# Patient Record
Sex: Male | Born: 2000 | Race: White | Hispanic: No | Marital: Single | Smoking: Never smoker
Health system: Southern US, Community
[De-identification: ages and names within clinical notes are randomized; demographics above are authoritative.]

## PROBLEM LIST (undated history)

## (undated) HISTORY — PX: NO PAST SURGERIES: SHX2092

---
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
## 2024-02-09 ENCOUNTER — Other Ambulatory Visit: Payer: Self-pay

## 2024-02-09 ENCOUNTER — Emergency Department (HOSPITAL_BASED_OUTPATIENT_CLINIC_OR_DEPARTMENT_OTHER): Payer: Self-pay

## 2024-02-09 ENCOUNTER — Encounter (HOSPITAL_BASED_OUTPATIENT_CLINIC_OR_DEPARTMENT_OTHER): Payer: Self-pay

## 2024-02-09 ENCOUNTER — Emergency Department (HOSPITAL_BASED_OUTPATIENT_CLINIC_OR_DEPARTMENT_OTHER)
Admission: EM | Admit: 2024-02-09 | Discharge: 2024-02-09 | Disposition: A | Discharge status: Home / Self Care | Payer: Self-pay | Attending: Emergency Medicine | Admitting: Emergency Medicine

## 2024-02-09 DIAGNOSIS — W3400XA Accidental discharge from unspecified firearms or gun, initial encounter: Secondary | ICD-10-CM | POA: Insufficient documentation

## 2024-02-09 DIAGNOSIS — S62609B Fracture of unspecified phalanx of unspecified finger, initial encounter for open fracture: Secondary | ICD-10-CM

## 2024-02-09 DIAGNOSIS — Z23 Encounter for immunization: Secondary | ICD-10-CM | POA: Insufficient documentation

## 2024-02-09 DIAGNOSIS — S62610B Displaced fracture of proximal phalanx of right index finger, initial encounter for open fracture: Secondary | ICD-10-CM | POA: Insufficient documentation

## 2024-02-09 HISTORY — DX: Accidental discharge from unspecified firearms or gun, initial encounter: W34.00XA

## 2024-02-09 MED ORDER — CEPHALEXIN 500 MG PO CAPS: 500.0000 mg | ORAL_CAPSULE | Freq: Three times a day (TID) | ORAL | 0 refills | Status: DC

## 2024-02-09 MED ORDER — HYDROCODONE-ACETAMINOPHEN 5-325 MG PO TABS: 1.0000 | ORAL_TABLET | ORAL | 0 refills | Status: AC | PRN

## 2024-02-09 MED ORDER — BACITRACIN ZINC 500 UNIT/GM EX OINT
TOPICAL_OINTMENT | Freq: Two times a day (BID) | CUTANEOUS | Status: DC
  Administered 2024-02-09: 31.5 via TOPICAL
  Filled 2024-02-09: qty 28.35

## 2024-02-09 MED ORDER — BUPIVACAINE HCL (PF) 0.5 % IJ SOLN
10.0000 mL | Freq: Once | INTRAMUSCULAR | Status: AC
  Administered 2024-02-09: 10 mL
  Filled 2024-02-09: qty 10

## 2024-02-09 MED ORDER — CLINDAMYCIN PHOSPHATE 900 MG/50ML IV SOLN
900.0000 mg | Freq: Once | INTRAVENOUS | Status: AC
  Administered 2024-02-09: 900 mg via INTRAVENOUS
  Filled 2024-02-09: qty 50

## 2024-02-09 MED ORDER — TETANUS-DIPHTH-ACELL PERTUSSIS 5-2.5-18.5 LF-MCG/0.5 IM SUSY
0.5000 mL | PREFILLED_SYRINGE | Freq: Once | INTRAMUSCULAR | Status: AC
  Administered 2024-02-09: 0.5 mL via INTRAMUSCULAR
  Filled 2024-02-09: qty 0.5
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
## 2024-02-10 ENCOUNTER — Encounter (HOSPITAL_BASED_OUTPATIENT_CLINIC_OR_DEPARTMENT_OTHER): Payer: Self-pay | Admitting: Orthopedic Surgery

## 2024-02-10 ENCOUNTER — Ambulatory Visit: Payer: Self-pay | Admitting: Orthopedic Surgery

## 2024-02-10 DIAGNOSIS — S62611A Displaced fracture of proximal phalanx of left index finger, initial encounter for closed fracture: Secondary | ICD-10-CM
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
## 2024-02-11 ENCOUNTER — Encounter (HOSPITAL_BASED_OUTPATIENT_CLINIC_OR_DEPARTMENT_OTHER)
Admission: RE | Disposition: A | Discharge status: Home / Self Care | Payer: Self-pay | Source: Home / Self Care | Attending: Orthopedic Surgery

## 2024-02-11 ENCOUNTER — Ambulatory Visit (HOSPITAL_BASED_OUTPATIENT_CLINIC_OR_DEPARTMENT_OTHER): Payer: Self-pay | Admitting: Anesthesiology

## 2024-02-11 ENCOUNTER — Ambulatory Visit (HOSPITAL_BASED_OUTPATIENT_CLINIC_OR_DEPARTMENT_OTHER): Payer: Self-pay

## 2024-02-11 ENCOUNTER — Ambulatory Visit (HOSPITAL_BASED_OUTPATIENT_CLINIC_OR_DEPARTMENT_OTHER)
Admission: RE | Admit: 2024-02-11 | Discharge: 2024-02-11 | Disposition: A | Discharge status: Home / Self Care | Payer: Self-pay | Attending: Orthopedic Surgery | Admitting: Orthopedic Surgery

## 2024-02-11 ENCOUNTER — Encounter (HOSPITAL_BASED_OUTPATIENT_CLINIC_OR_DEPARTMENT_OTHER): Payer: Self-pay | Admitting: Orthopedic Surgery

## 2024-02-11 ENCOUNTER — Other Ambulatory Visit: Payer: Self-pay

## 2024-02-11 DIAGNOSIS — S61230A Puncture wound without foreign body of right index finger without damage to nail, initial encounter: Secondary | ICD-10-CM

## 2024-02-11 DIAGNOSIS — S62610B Displaced fracture of proximal phalanx of right index finger, initial encounter for open fracture: Secondary | ICD-10-CM

## 2024-02-11 DIAGNOSIS — W3400XA Accidental discharge from unspecified firearms or gun, initial encounter: Secondary | ICD-10-CM | POA: Insufficient documentation

## 2024-02-11 HISTORY — PX: REPAIR EXTENSOR TENDON: SHX5382

## 2024-02-11 HISTORY — PX: IRRIGATION AND DEBRIDEMENT POSTERIOR HIP: SHX7265

## 2024-02-11 HISTORY — PX: OPEN REDUCTION INTERNAL FIXATION (ORIF) DISTAL PHALANX: SHX6236

## 2024-02-11 SURGERY — OPEN REDUCTION INTERNAL FIXATION (ORIF) DISTAL PHALANX
Anesthesia: General | Site: Index Finger | Laterality: Right

## 2024-02-11 MED ORDER — ROPIVACAINE HCL 5 MG/ML IJ SOLN
INTRAMUSCULAR | Status: AC
Start: 2024-02-11 — End: 2024-02-11
  Filled 2024-02-11: qty 30

## 2024-02-11 MED ORDER — OXYCODONE HCL 5 MG PO TABS
ORAL_TABLET | ORAL | Status: AC
Start: 2024-02-11 — End: 2024-02-11
  Filled 2024-02-11: qty 1

## 2024-02-11 MED ORDER — FENTANYL CITRATE (PF) 100 MCG/2ML IJ SOLN
INTRAMUSCULAR | Status: DC | PRN
  Administered 2024-02-11 (×2): 50 ug via INTRAVENOUS

## 2024-02-11 MED ORDER — LACTATED RINGERS IV SOLN: INTRAVENOUS | Status: DC

## 2024-02-11 MED ORDER — MIDAZOLAM HCL 2 MG/2ML IJ SOLN
INTRAMUSCULAR | Status: AC
  Filled 2024-02-11: qty 2

## 2024-02-11 MED ORDER — DEXAMETHASONE SODIUM PHOSPHATE 10 MG/ML IJ SOLN
INTRAMUSCULAR | Status: AC
  Filled 2024-02-11: qty 1

## 2024-02-11 MED ORDER — OXYCODONE HCL 5 MG/5ML PO SOLN: 5.0000 mg | Freq: Once | ORAL | Status: AC | PRN

## 2024-02-11 MED ORDER — LIDOCAINE 2% (20 MG/ML) 5 ML SYRINGE
INTRAMUSCULAR | Status: DC | PRN
  Administered 2024-02-11: 100 mg via INTRAVENOUS

## 2024-02-11 MED ORDER — FENTANYL CITRATE (PF) 100 MCG/2ML IJ SOLN
INTRAMUSCULAR | Status: AC
  Filled 2024-02-11: qty 2

## 2024-02-11 MED ORDER — ACETAMINOPHEN 10 MG/ML IV SOLN: 1000.0000 mg | Freq: Once | INTRAVENOUS | Status: DC | PRN

## 2024-02-11 MED ORDER — LIDOCAINE HCL 1 % IJ SOLN
INTRAMUSCULAR | Status: DC | PRN
  Administered 2024-02-11: 10 mL

## 2024-02-11 MED ORDER — CEFAZOLIN SODIUM-DEXTROSE 2-4 GM/100ML-% IV SOLN
INTRAVENOUS | Status: AC
  Filled 2024-02-11: qty 100

## 2024-02-11 MED ORDER — PROPOFOL 10 MG/ML IV BOLUS
INTRAVENOUS | Status: AC
  Filled 2024-02-11: qty 20

## 2024-02-11 MED ORDER — LIDOCAINE 2% (20 MG/ML) 5 ML SYRINGE
INTRAMUSCULAR | Status: AC
  Filled 2024-02-11: qty 5

## 2024-02-11 MED ORDER — MIDAZOLAM HCL 5 MG/5ML IJ SOLN
INTRAMUSCULAR | Status: DC | PRN
Start: 2024-02-11 — End: 2024-02-11
  Administered 2024-02-11: 2 mg via INTRAVENOUS

## 2024-02-11 MED ORDER — OXYCODONE HCL 5 MG PO TABS: 5.0000 mg | ORAL_TABLET | Freq: Four times a day (QID) | ORAL | 0 refills | Status: AC | PRN

## 2024-02-11 MED ORDER — DEXMEDETOMIDINE HCL IN NACL 80 MCG/20ML IV SOLN
INTRAVENOUS | Status: DC | PRN
  Administered 2024-02-11 (×2): 4 ug via INTRAVENOUS

## 2024-02-11 MED ORDER — DEXAMETHASONE SODIUM PHOSPHATE 10 MG/ML IJ SOLN
INTRAMUSCULAR | Status: DC | PRN
  Administered 2024-02-11: 10 mg via INTRAVENOUS

## 2024-02-11 MED ORDER — OXYCODONE HCL 5 MG PO TABS
5.0000 mg | ORAL_TABLET | Freq: Once | ORAL | Status: AC | PRN
  Administered 2024-02-11: 5 mg via ORAL

## 2024-02-11 MED ORDER — PROPOFOL 10 MG/ML IV BOLUS
INTRAVENOUS | Status: DC | PRN
  Administered 2024-02-11: 200 mg via INTRAVENOUS

## 2024-02-11 MED ORDER — 0.9 % SODIUM CHLORIDE (POUR BTL) OPTIME
TOPICAL | Status: DC | PRN
  Administered 2024-02-11: 1000 mL

## 2024-02-11 MED ORDER — ONDANSETRON HCL 4 MG/2ML IJ SOLN
INTRAMUSCULAR | Status: AC
  Filled 2024-02-11: qty 2

## 2024-02-11 MED ORDER — ONDANSETRON HCL 4 MG/2ML IJ SOLN: 4.0000 mg | Freq: Once | INTRAMUSCULAR | Status: DC | PRN

## 2024-02-11 MED ORDER — ONDANSETRON HCL 4 MG/2ML IJ SOLN
INTRAMUSCULAR | Status: DC | PRN
  Administered 2024-02-11: 4 mg via INTRAVENOUS

## 2024-02-11 MED ORDER — CEFAZOLIN SODIUM-DEXTROSE 2-4 GM/100ML-% IV SOLN
2.0000 g | INTRAVENOUS | Status: AC
  Administered 2024-02-11: 2 g via INTRAVENOUS

## 2024-02-11 MED ORDER — FENTANYL CITRATE (PF) 100 MCG/2ML IJ SOLN
25.0000 ug | INTRAMUSCULAR | Status: DC | PRN
  Administered 2024-02-11: 50 ug via INTRAVENOUS

## 2024-02-11 SURGICAL SUPPLY — 75 items
BLADE ARTHRO LOK 4 BEAVER (BLADE) ×2 IMPLANT
BLADE AVERAGE 25X9 (BLADE) IMPLANT
BLADE SURG 11 STRL SS (BLADE) IMPLANT
BLADE SURG 15 STRL LF DISP TIS (BLADE) ×4 IMPLANT
BNDG COHESIVE 1X5 TAN STRL LF (GAUZE/BANDAGES/DRESSINGS) IMPLANT
BNDG COHESIVE 4X5 TAN STRL LF (GAUZE/BANDAGES/DRESSINGS) ×1 IMPLANT
BNDG COMPR ESMARK 4X3 LF (GAUZE/BANDAGES/DRESSINGS) ×2 IMPLANT
BNDG ELASTIC 3INX 5YD STR LF (GAUZE/BANDAGES/DRESSINGS) ×1 IMPLANT
BNDG ELASTIC 4INX 5YD STR LF (GAUZE/BANDAGES/DRESSINGS) ×3 IMPLANT
BNDG GAUZE DERMACEA FLUFF 4 (GAUZE/BANDAGES/DRESSINGS) ×2 IMPLANT
CAP PIN PROTECTOR ORTHO WHT (CAP) ×1 IMPLANT
CATH ROBINSON RED A/P 10FR (CATHETERS) IMPLANT
CATH ROBINSON RED A/P 8FR (CATHETERS) IMPLANT
CHLORAPREP W/TINT 26 (MISCELLANEOUS) ×2 IMPLANT
CORD BIPOLAR FORCEPS 12FT (ELECTRODE) ×2 IMPLANT
COVER BACK TABLE 60X90IN (DRAPES) ×2 IMPLANT
CUFF TOURN SGL QUICK 18X4 (TOURNIQUET CUFF) ×2 IMPLANT
CUFF TRNQT CYL 24X4X16.5-23 (TOURNIQUET CUFF) ×1 IMPLANT
DRAPE HAND 77X146 (DRAPES) ×2 IMPLANT
DRAPE IMP U-DRAPE 54X76 (DRAPES) ×2 IMPLANT
DRAPE OEC MINIVIEW 54X84 (DRAPES) ×2 IMPLANT
DRAPE SURG 17X23 STRL (DRAPES) ×2 IMPLANT
DRSG TELFA 3X8 NADH STRL (GAUZE/BANDAGES/DRESSINGS) IMPLANT
GAUZE PAD ABD 8X10 STRL (GAUZE/BANDAGES/DRESSINGS) ×1 IMPLANT
GAUZE SPONGE 4X4 12PLY STRL (GAUZE/BANDAGES/DRESSINGS) ×2 IMPLANT
GAUZE STRETCH 2X75IN STRL (MISCELLANEOUS) ×2 IMPLANT
GAUZE XEROFORM 1X8 LF (GAUZE/BANDAGES/DRESSINGS) ×3 IMPLANT
GLOVE BIO SURGEON STRL SZ7.5 (GLOVE) ×4 IMPLANT
GLOVE BIOGEL PI IND STRL 7.0 (GLOVE) ×1 IMPLANT
GLOVE BIOGEL PI IND STRL 7.5 (GLOVE) ×2 IMPLANT
GOWN STRL REUS W/ TWL LRG LVL3 (GOWN DISPOSABLE) ×4 IMPLANT
GOWN STRL REUS W/TWL XL LVL3 (GOWN DISPOSABLE) ×1 IMPLANT
GOWN STRL SURGICAL XL XLNG (GOWN DISPOSABLE) ×3 IMPLANT
KWIRE DBL .045X4 NSTRL (WIRE) ×2 IMPLANT
KWIRE DBL .054X4 NSTRL (WIRE) ×2 IMPLANT
MANIFOLD NEPTUNE II (INSTRUMENTS) ×2 IMPLANT
NDL HYPO 25X1 1.5 SAFETY (NEEDLE) IMPLANT
NDL HYPO 25X5/8 SAFETYGLIDE (NEEDLE) IMPLANT
NDL SUT 6 .5 CRC .975X.05 MAYO (NEEDLE) IMPLANT
NEEDLE HYPO 25X1 1.5 SAFETY (NEEDLE) ×2 IMPLANT
NEEDLE HYPO 25X5/8 SAFETYGLIDE (NEEDLE) IMPLANT
NS IRRIG 1000ML POUR BTL (IV SOLUTION) ×2 IMPLANT
PACK BASIN DAY SURGERY FS (CUSTOM PROCEDURE TRAY) ×2 IMPLANT
PAD CAST 3X4 CTTN HI CHSV (CAST SUPPLIES) ×1 IMPLANT
PAD CAST 4YDX4 CTTN HI CHSV (CAST SUPPLIES) ×3 IMPLANT
SHEET MEDIUM DRAPE 40X70 STRL (DRAPES) ×2 IMPLANT
SLEEVE SCD COMPRESS KNEE MED (STOCKING) ×2 IMPLANT
SLING ARM FOAM STRAP LRG (SOFTGOODS) ×1 IMPLANT
SOL PREP POV-IOD 4OZ 10% (MISCELLANEOUS) ×1 IMPLANT
SOLUTION SCRB POV-IOD 4OZ 7.5% (MISCELLANEOUS) ×1 IMPLANT
SPIKE FLUID TRANSFER (MISCELLANEOUS) IMPLANT
SPLINT PLASTER CAST XFAST 4X15 (CAST SUPPLIES) ×20 IMPLANT
SPONGE SURGIFOAM ABS GEL 12-7 (HEMOSTASIS) IMPLANT
STOCKINETTE IMPERVIOUS 9X36 MD (GAUZE/BANDAGES/DRESSINGS) ×2 IMPLANT
SUCTION TUBE FRAZIER 10FR DISP (SUCTIONS) ×1 IMPLANT
SUT CHROMIC 3 0 SH 27 (SUTURE) IMPLANT
SUT ETHIBOND 0 MO6 C/R (SUTURE) IMPLANT
SUT ETHILON 4 0 PS 2 18 (SUTURE) ×3 IMPLANT
SUT MNCRL AB 3-0 PS2 27 (SUTURE) ×2 IMPLANT
SUT MNCRL AB 4-0 PS2 18 (SUTURE) IMPLANT
SUT PDS AB 4-0 RB1 27 (SUTURE) IMPLANT
SUT PROLENE 5 0 PS 2 (SUTURE) ×1 IMPLANT
SUT SILK 4 0 PS 2 (SUTURE) IMPLANT
SUT VIC AB 3-0 FS2 27 (SUTURE) IMPLANT
SUT VIC AB 3-0 PS2 18XBRD (SUTURE) IMPLANT
SUT VIC AB 3-0 SH 27X BRD (SUTURE) IMPLANT
SUT VIC AB 4-0 PS2 18 (SUTURE) IMPLANT
SWAB COLLECTION DEVICE MRSA (MISCELLANEOUS) IMPLANT
SWAB CULTURE ESWAB REG 1ML (MISCELLANEOUS) IMPLANT
SYR BULB EAR ULCER 3OZ GRN STR (SYRINGE) ×4 IMPLANT
SYR CONTROL 10ML LL (SYRINGE) ×1 IMPLANT
TOWEL GREEN STERILE FF (TOWEL DISPOSABLE) ×4 IMPLANT
TRAY DSU PREP LF (CUSTOM PROCEDURE TRAY) ×1 IMPLANT
TUBE CONNECTING 20X1/4 (TUBING) ×1 IMPLANT
UNDERPAD 30X36 HEAVY ABSORB (UNDERPADS AND DIAPERS) ×2 IMPLANT
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
## 2024-02-11 NOTE — Anesthesia Preprocedure Evaluation (Signed)
 Anesthesia Evaluation  Patient identified by MRN, date of birth, ID band Patient awake    Reviewed: Allergy & Precautions, NPO status , Patient's Chart, lab work & pertinent test results, reviewed documented beta blocker date and time   History of Anesthesia Complications Negative for: history of anesthetic complications  Airway Mallampati: II  TM Distance: >3 FB     Dental no notable dental hx.    Pulmonary neg COPD   breath sounds clear to auscultation       Cardiovascular (-) hypertension(-) CAD, (-) Past MI and (-) Cardiac Stents  Rhythm:Regular Rate:Normal     Neuro/Psych neg Seizures    GI/Hepatic negative GI ROS, Neg liver ROS,,,  Endo/Other  negative endocrine ROS    Renal/GU negative Renal ROS     Musculoskeletal   Abdominal   Peds  Hematology   Anesthesia Other Findings   Reproductive/Obstetrics                              Anesthesia Physical Anesthesia Plan  ASA: 2  Anesthesia Plan: General   Post-op Pain Management:    Induction: Intravenous  PONV Risk Score and Plan: 2 and Ondansetron  and Dexamethasone   Airway Management Planned: Oral ETT  Additional Equipment:   Intra-op Plan:   Post-operative Plan:   Informed Consent: I have reviewed the patients History and Physical, chart, labs and discussed the procedure including the risks, benefits and alternatives for the proposed anesthesia with the patient or authorized representative who has indicated his/her understanding and acceptance.     Dental advisory given  Plan Discussed with: CRNA  Anesthesia Plan Comments:          Anesthesia Quick Evaluation

## 2024-02-11 NOTE — Interval H&P Note (Signed)
 History and Physical Interval Note:  02/11/2024 10:31 AM  Thomas Kemp  has presented today for surgery, with the diagnosis of RIGHT INDEX PROXIMAL PHALANX FRACTURE.  The various methods of treatment have been discussed with the patient and family. After consideration of risks, benefits and other options for treatment, the patient has consented to  Procedure(s) with comments: OPEN REDUCTION INTERNAL FIXATION (ORIF) DISTAL PHALANX (Right) - RIGHT INDEX PROXIMAL PHALANX INCISION AND DRAINAGE WITH CLOSED VS OPEN REDUCTION AND PERCUTANEOUS PINNING / POSSIBLE FLEXOR TENDON AND EXTENSOR TENDON REPAIR IRRIGATION AND DEBRIDEMENT, OPEN FRACTURE (Right) REPAIR, TENDON, FLEXOR (Right) REPAIR, TENDON, EXTENSOR (Right) as a surgical intervention.  The patient's history has been reviewed, patient examined, no change in status, stable for surgery.  I have reviewed the patient's chart and labs.  Questions were answered to the patient's satisfaction.     Williard Keller

## 2024-02-11 NOTE — Transfer of Care (Signed)
 Immediate Anesthesia Transfer of Care Note  Patient: Thomas Kemp  Procedure(s) Performed: OPEN REDUCTION INTERNAL FIXATION (ORIF) DISTAL PHALANX (Right: Index Finger) IRRIGATION AND DEBRIDEMENT, OPEN FRACTURE (Right: Index Finger) REPAIR, TENDON, EXTENSOR (Right: Index Finger)  Patient Location: PACU  Anesthesia Type:General  Level of Consciousness: sedated  Airway & Oxygen Therapy: Patient Spontanous Breathing and Patient connected to face mask oxygen  Post-op Assessment: Report given to RN and Post -op Vital signs reviewed and stable  Post vital signs: Reviewed and stable  Last Vitals:  Vitals Value Taken Time  BP 116/61 02/11/24 14:45  Temp 36.3 C 02/11/24 14:40  Pulse 64 02/11/24 14:48  Resp 15 02/11/24 14:48  SpO2 98 % 02/11/24 14:48  Vitals shown include unfiled device data.  Last Pain:  Vitals:   02/11/24 1034  TempSrc: Temporal  PainSc: 6          Complications: No notable events documented.

## 2024-02-11 NOTE — Discharge Instructions (Addendum)
   Hand Surgery Postop Instructions   Dressings: Maintain postoperative dressing until orthopedic follow-up.  Keep operative site clean and dry until orthopedic follow-up.  Wound Care: Keep your hand elevated above the level of your heart.  Do not allow it to dangle by your side. Moving your fingers is advised to stimulate circulation but will depend on the site of your surgery.  If you have a splint applied, your doctor will advise you regarding movement.  Activity: Do not drive or operate machinery until clearance given from physician. No heavy lifting with operative extremity.  Diet:  Drink liquids today or eat a light diet.  You may resume a regular diet tomorrow.    General expectations: Take prescribed medication if given, transition to over-the-counter medication as quickly as possible. Fingers may become slightly swollen.  Call your doctor if any of the following occur: Severe pain not relieved by pain medication. Elevated temperature. Dressing soaked with blood. Inability to move fingers. White or bluish color to fingers.   Per [facility] clinic policy, our goal is ensure optimal postoperative pain control with a multimodal pain management strategy. For all [facility] patients, our goal is to wean post-operative narcotic medications by 6 weeks post-operatively. If this is not possible due to utilization of pain medication prior to surgery, your [facility] doctor will support your acute post-operative pain control for the first 6 weeks postoperatively, with a plan to transition you back to your primary pain team following that. Cyndia Skeeters will work to ensure a Therapist, occupational.  Anshul Trevor Mace, M.D. Hand Surgery [facility]   Post Anesthesia Home Care Instructions  Activity: Get plenty of rest for the remainder of the day. A responsible individual must stay with you for 24 hours following the procedure.  For the next 24 hours, DO NOT: -Drive a  car -Advertising copywriter -Drink alcoholic beverages -Take any medication unless instructed by your physician -Make any legal decisions or sign important papers.  Meals: Start with liquid foods such as gelatin or soup. Progress to regular foods as tolerated. Avoid greasy, spicy, heavy foods. If nausea and/or vomiting occur, drink only clear liquids until the nausea and/or vomiting subsides. Call your physician if vomiting continues.  Special Instructions/Symptoms: Your throat may feel dry or sore from the anesthesia or the breathing tube placed in your throat during surgery. If this causes discomfort, gargle with warm salt water. The discomfort should disappear within 24 hours.  If you had a scopolamine patch placed behind your ear for the management of post- operative nausea and/or vomiting:  1. The medication in the patch is effective for 72 hours, after which it should be removed.  Wrap patch in a tissue and discard in the trash. Wash hands thoroughly with soap and water. 2. You may remove the patch earlier than 72 hours if you experience unpleasant side effects which may include dry mouth, dizziness or visual disturbances. 3. Avoid touching the patch. Wash your hands with soap and water after contact with the patch.

## 2024-02-11 NOTE — Anesthesia Postprocedure Evaluation (Signed)
 Anesthesia Post Note  Patient: Thomas Kemp  Procedure(s) Performed: OPEN REDUCTION INTERNAL FIXATION (ORIF) DISTAL PHALANX (Right: Index Finger) IRRIGATION AND DEBRIDEMENT, OPEN FRACTURE (Right: Index Finger) REPAIR, TENDON, EXTENSOR (Right: Index Finger)     Patient location during evaluation: PACU Anesthesia Type: General Level of consciousness: awake and alert Pain management: pain level controlled Vital Signs Assessment: post-procedure vital signs reviewed and stable Respiratory status: spontaneous breathing, nonlabored ventilation, respiratory function stable and patient connected to nasal cannula oxygen Cardiovascular status: stable and blood pressure returned to baseline Postop Assessment: no apparent nausea or vomiting Anesthetic complications: no   No notable events documented.  Last Vitals:  Vitals:   02/11/24 1515 02/11/24 1525  BP: (!) 149/98   Pulse: 81 63  Resp: 17 13  Temp:    SpO2: 96% 96%    Last Pain:  Vitals:   02/11/24 1522  TempSrc:   PainSc: Asleep                 Lynwood MARLA Cornea

## 2024-02-11 NOTE — Anesthesia Procedure Notes (Signed)
 Procedure Name: LMA Insertion Date/Time: 02/11/2024 12:49 PM  Performed by: Julieanne Fairy BROCKS, CRNAPre-anesthesia Checklist: Patient identified, Emergency Drugs available, Suction available and Patient being monitored Patient Re-evaluated:Patient Re-evaluated prior to induction Oxygen Delivery Method: Circle system utilized Preoxygenation: Pre-oxygenation with 100% oxygen Induction Type: IV induction Ventilation: Mask ventilation without difficulty LMA: LMA inserted LMA Size: 5.0 Number of attempts: 1 Airway Equipment and Method: Bite block Placement Confirmation: positive ETCO2 Tube secured with: Tape Dental Injury: Teeth and Oropharynx as per pre-operative assessment

## 2024-02-11 NOTE — Addendum Note (Signed)
 Addended by: VANDERBILT LIONEL CROME on: 02/11/2024 03:30 PM   Modules accepted: Orders

## 2024-02-12 ENCOUNTER — Encounter (HOSPITAL_BASED_OUTPATIENT_CLINIC_OR_DEPARTMENT_OTHER): Payer: Self-pay | Admitting: Orthopedic Surgery

## 2024-02-19 ENCOUNTER — Ambulatory Visit (INDEPENDENT_AMBULATORY_CARE_PROVIDER_SITE_OTHER): Payer: Self-pay | Admitting: Orthopedic Surgery

## 2024-02-19 DIAGNOSIS — S62611A Displaced fracture of proximal phalanx of left index finger, initial encounter for closed fracture: Secondary | ICD-10-CM

## 2024-02-19 MED ORDER — CEPHALEXIN 500 MG PO CAPS: 500.0000 mg | ORAL_CAPSULE | Freq: Three times a day (TID) | ORAL | 0 refills | Status: AC

## 2024-02-19 NOTE — Progress Notes (Signed)
 Patient came in today for a wound check. Patients incision was a little macerated. I discussed this with Dr Erwin who advised to put patient on a weeks worth of keflex . I rewrapped the patient in a claw splint and gave patient supplies to re-wrap it twice before next visit. He understood all wound care instructions. He will return next week unless wound gets worse. He understands he needs to call us  back to be seen.

## 2024-02-25 ENCOUNTER — Other Ambulatory Visit: Payer: Self-pay

## 2024-02-25 ENCOUNTER — Ambulatory Visit (INDEPENDENT_AMBULATORY_CARE_PROVIDER_SITE_OTHER): Payer: Self-pay | Admitting: Orthopedic Surgery

## 2024-02-25 DIAGNOSIS — S62611A Displaced fracture of proximal phalanx of left index finger, initial encounter for closed fracture: Secondary | ICD-10-CM

## 2024-02-25 DIAGNOSIS — S62610A Displaced fracture of proximal phalanx of right index finger, initial encounter for closed fracture: Secondary | ICD-10-CM

## 2024-02-25 NOTE — Progress Notes (Unsigned)
.    Peniel Hass - 23 y.o. male MRN [id number]  Date of birth: 2000/08/11  Office Visit Note: Visit Date: 02/25/2024 PCP: Patient, No Pcp Per Referred by: No ref. provider found  Subjective:  HPI: Melvern Ramone is a 23 y.o. male who presents today for follow up 2 weeks status post RIGHT INDEX PROXIMAL PHALANX  OPEN REDUCTION AND PERCUTANEOUS PINNING.SABRA  Pertinent ROS were reviewed with the patient and found to be negative unless otherwise specified above in HPI.   Assessment & Plan: Visit Diagnoses: No diagnosis found.  Plan: ***  Follow-up: No follow-ups on file.   Meds & Orders: No orders of the defined types were placed in this encounter.  No orders of the defined types were placed in this encounter.    Procedures: No procedures performed       Objective:   Vital Signs: There were no vitals taken for this visit.  Ortho Exam ***  Imaging: No results found.   Cristino Degroff [name], M.D. [facility], Hand Surgery

## 2024-03-03 ENCOUNTER — Ambulatory Visit (INDEPENDENT_AMBULATORY_CARE_PROVIDER_SITE_OTHER): Payer: Self-pay | Admitting: Orthopedic Surgery

## 2024-03-03 ENCOUNTER — Other Ambulatory Visit: Payer: Self-pay

## 2024-03-03 DIAGNOSIS — S62611A Displaced fracture of proximal phalanx of left index finger, initial encounter for closed fracture: Secondary | ICD-10-CM

## 2024-03-03 NOTE — Progress Notes (Signed)
.    Thomas Kemp - 23 y.o. male MRN [id number]  Date of birth: 2001/04/27  Office Visit Note: Visit Date: 03/03/2024 PCP: Patient, No Pcp Per Referred by: No ref. provider found  Subjective:  HPI: Thomas Kemp is a 23 y.o. male who presents today for follow up 3-week status post right index finger irrigation and debridement with open reduction and pinning of proximal phalanx fracture, extensor tendon repair.  He is doing well overall.  Pain is well-controlled.  No systemic symptoms.  Pertinent ROS were reviewed with the patient and found to be negative unless otherwise specified above in HPI.   Assessment & Plan: Visit Diagnoses:  1. Closed displaced fracture of proximal phalanx of left index finger, initial encounter     Plan: He is doing well overall.  Sutures removed today without incident.  Remain in splint for additional 1 week.  Will begin occupational therapy next week for range of motion exercises.  Follow-up: No follow-ups on file.   Meds & Orders: No orders of the defined types were placed in this encounter.   No orders of the defined types were placed in this encounter.    Procedures: No procedures performed       Objective:   Vital Signs: There were no vitals taken for this visit.  Ortho Exam Right hand: - Index finger well-healing dorsal and volar wounds, sutures removed, skin edges well approximate without erythema or drainage - Pin sites remain clean dry and intact  Imaging: No results found.    Thomas Kemp Thomas Kemp, M.D. [facility], Hand Surgery

## 2024-03-09 ENCOUNTER — Ambulatory Visit: Payer: Self-pay | Admitting: Occupational Therapy

## 2024-03-10 ENCOUNTER — Ambulatory Visit (INDEPENDENT_AMBULATORY_CARE_PROVIDER_SITE_OTHER): Payer: Self-pay | Admitting: Orthopedic Surgery

## 2024-03-10 ENCOUNTER — Other Ambulatory Visit: Payer: Self-pay

## 2024-03-10 DIAGNOSIS — S62611A Displaced fracture of proximal phalanx of left index finger, initial encounter for closed fracture: Secondary | ICD-10-CM

## 2024-03-10 NOTE — Progress Notes (Signed)
.    Thomas Kemp - 23 y.o. male MRN [id number]  Date of birth: Jan 06, 2001  Office Visit Note: Visit Date: 03/10/2024 PCP: Patient, No Pcp Per Referred by: No ref. provider found  Subjective:  HPI: Thomas Kemp is a 23 y.o. male who presents today for follow up 4 week status post right index finger irrigation and debridement with open reduction and pinning of proximal phalanx fracture, extensor tendon repair.  He is doing well overall.  Pain is well-controlled.  No systemic symptoms.  Pertinent ROS were reviewed with the patient and found to be negative unless otherwise specified above in HPI.   Assessment & Plan: Visit Diagnoses:  1. Closed displaced fracture of proximal phalanx of left index finger, initial encounter     Plan: He is doing well overall.  Pins removed today without incident.  X-rays demonstrate stable appearance of the proximal phalanx fracture with appropriate interval healing.  Will have him be seen by occupational therapy this coming week to begin range of motion exercises.  Remain nonweightbearing.  Follow-up: No follow-ups on file.   Meds & Orders: No orders of the defined types were placed in this encounter.   Orders Placed This Encounter  Procedures   XR Hand Complete Right     Procedures: No procedures performed       Objective:   Vital Signs: There were no vitals taken for this visit.  Ortho Exam Right hand: - Index finger well-healing - Pin sites remain clean dry and intact, pins removed today without incident - No significant rotational abnormalities - Sensation intact in distal aspect of the index finger, appropriate color and capillary refill - Range of motion is limited at the index finger secondary to pain  Imaging: No results found.    Magic Mohler [name], M.D. [facility], Hand Surgery

## 2024-03-17 ENCOUNTER — Ambulatory Visit: Payer: Self-pay | Attending: Orthopedic Surgery | Admitting: Occupational Therapy

## 2024-03-17 ENCOUNTER — Other Ambulatory Visit: Payer: Self-pay

## 2024-03-17 DIAGNOSIS — M6281 Muscle weakness (generalized): Secondary | ICD-10-CM | POA: Insufficient documentation

## 2024-03-17 DIAGNOSIS — S62611A Displaced fracture of proximal phalanx of left index finger, initial encounter for closed fracture: Secondary | ICD-10-CM | POA: Insufficient documentation

## 2024-03-17 DIAGNOSIS — M25641 Stiffness of right hand, not elsewhere classified: Secondary | ICD-10-CM | POA: Insufficient documentation

## 2024-03-17 DIAGNOSIS — M79644 Pain in right finger(s): Secondary | ICD-10-CM | POA: Insufficient documentation
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
## 2024-03-24 ENCOUNTER — Ambulatory Visit: Payer: Self-pay | Admitting: Orthopedic Surgery

## 2024-03-25 ENCOUNTER — Ambulatory Visit: Payer: Self-pay | Admitting: Orthopedic Surgery

## 2024-04-07 ENCOUNTER — Telehealth: Payer: Self-pay | Admitting: Orthopedic Surgery

## 2024-04-07 NOTE — Telephone Encounter (Signed)
 Pt called wanting to reschedule appt on 9/17. Dr Erwin doesn't have appt until the 27th. Please call back to reschedule. Pt number is [phone].

## 2024-04-07 NOTE — Telephone Encounter (Signed)
Attempted to reach patient. No answer and no VM.  

## 2024-04-07 NOTE — Telephone Encounter (Signed)
 Tried to call to scheduled tomorrow 04/08/2024 before OT appointment but pt did no answer and does not have VM box set up

## 2024-04-08 ENCOUNTER — Ambulatory Visit: Payer: Self-pay | Attending: Orthopedic Surgery | Admitting: Occupational Therapy

## 2024-04-08 DIAGNOSIS — M6281 Muscle weakness (generalized): Secondary | ICD-10-CM | POA: Insufficient documentation

## 2024-04-08 DIAGNOSIS — M25641 Stiffness of right hand, not elsewhere classified: Secondary | ICD-10-CM | POA: Insufficient documentation

## 2024-04-08 DIAGNOSIS — M79644 Pain in right finger(s): Secondary | ICD-10-CM | POA: Insufficient documentation
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
## 2024-04-12 ENCOUNTER — Ambulatory Visit: Payer: Self-pay | Admitting: Occupational Therapy
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
## 2024-04-15 ENCOUNTER — Ambulatory Visit: Payer: Self-pay | Admitting: Occupational Therapy
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
## 2024-04-19 ENCOUNTER — Ambulatory Visit: Payer: Self-pay | Admitting: Occupational Therapy

## 2024-04-19 DIAGNOSIS — M25641 Stiffness of right hand, not elsewhere classified: Secondary | ICD-10-CM

## 2024-04-19 DIAGNOSIS — M6281 Muscle weakness (generalized): Secondary | ICD-10-CM

## 2024-04-19 DIAGNOSIS — M79644 Pain in right finger(s): Secondary | ICD-10-CM
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
## 2024-04-21 ENCOUNTER — Ambulatory Visit: Payer: Self-pay | Admitting: Occupational Therapy

## 2024-04-26 ENCOUNTER — Ambulatory Visit: Payer: Self-pay | Admitting: Occupational Therapy

## 2024-04-28 ENCOUNTER — Ambulatory Visit: Payer: Self-pay | Admitting: Occupational Therapy

## 2024-05-03 ENCOUNTER — Ambulatory Visit: Payer: Self-pay | Admitting: Occupational Therapy
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
## 2024-05-05 ENCOUNTER — Ambulatory Visit: Payer: Self-pay | Admitting: Occupational Therapy
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
## 2024-05-10 ENCOUNTER — Ambulatory Visit: Payer: Self-pay | Admitting: Occupational Therapy

## 2024-05-12 ENCOUNTER — Ambulatory Visit: Payer: Self-pay | Attending: Orthopedic Surgery | Admitting: Occupational Therapy
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
## 2024-05-18 ENCOUNTER — Ambulatory Visit: Payer: Self-pay | Admitting: Orthopedic Surgery

## 2024-05-18 ENCOUNTER — Other Ambulatory Visit: Payer: Self-pay

## 2024-05-18 DIAGNOSIS — S62611A Displaced fracture of proximal phalanx of left index finger, initial encounter for closed fracture: Secondary | ICD-10-CM

## 2024-05-18 NOTE — Progress Notes (Signed)
   Jahmel Flannagan - 23 y.o. male MRN [id number]  Date of birth: [birth year]  Office Visit Note: Visit Date: 05/18/2024 PCP: Patient, No Pcp Per Referred by: No ref. provider found  Subjective:  HPI: Lexington Krotz is a 23 y.o. male who presents today for follow up 3 months status post  right index finger irrigation and debridement with open reduction and pinning of proximal phalanx fracture, extensor tendon repair.  Doing well overall, has been unable to attend many of his occupational therapy sessions due to cost.  Has been doing home exercises.  .  Pertinent ROS were reviewed with the patient and found to be negative unless otherwise specified above in HPI.   Assessment & Plan: Visit Diagnoses:  1. Closed displaced fracture of proximal phalanx of left index finger, initial encounter     Plan: He is demonstrating appropriate healing of the index finger.  Given the severity of his initial bony injury with comminution, he is demonstrating appropriate bony consolidation at this time which correlates with his ongoing clinical exam.  He should continue with range of motion exercises of the digit and the ongoing stiffness in this region.  He does demonstrate appropriate passive motion, will continue with active and passive range of motion protocol to maximize postoperative range of motion.  I did explain that given the severity of this injury with the notable scar formation, there may be some slight extensor lag that persists, our major goal at this time is to achieve a composite fist.  Follow-up in 3 months.  Follow-up: No follow-ups on file.   Meds & Orders: No orders of the defined types were placed in this encounter.   Orders Placed This Encounter  Procedures   XR Hand Complete Right     Procedures: No procedures performed       Objective:   Vital Signs: There were no vitals taken for this visit.  Ortho Exam Right index finger - Minimal tenderness diffusely throughout the P1  region - Able to perform passive motion with flexion at the PIP and DIP near full, approximately 1 cm from distal palmar crease with passive motion - Active motion of the index finger MP 0-60, PIP 0-50, DIP 0-20 - Sensation intact distally, digit remains warm well-perfused  Imaging: XR Hand Complete Right Result Date: 05/18/2024 X-rays of the right hand demonstrate the previously known index finger proximal phalanx fracture with notable comminution.  In comparison to prior films, there is ongoing bony consolidation along the radial and ulnar cortex.  There is persistent lucency within the midline portion of the proximal phalanx.  MP and PIP joints are well-preserved.    Dellamae Rosamilia [name], M.D. [facility], Hand Surgery

## 2024-08-18 ENCOUNTER — Encounter: Payer: Self-pay | Admitting: Orthopedic Surgery
